# Patient Record
Sex: Female | Born: 1975 | Race: White | Hispanic: No | Marital: Single | Smoking: Current every day smoker
Health system: Southern US, Community
[De-identification: ages and names within clinical notes are randomized; demographics above are authoritative.]

## PROBLEM LIST (undated history)

## (undated) DIAGNOSIS — I10 Essential (primary) hypertension: Secondary | ICD-10-CM

## (undated) DIAGNOSIS — O24419 Gestational diabetes mellitus in pregnancy, unspecified control: Secondary | ICD-10-CM

## (undated) HISTORY — DX: Gestational diabetes mellitus in pregnancy, unspecified control: O24.419

## (undated) HISTORY — PX: WISDOM TOOTH EXTRACTION: SHX21

## (undated) HISTORY — DX: Essential (primary) hypertension: I10

---

## 2003-09-29 ENCOUNTER — Encounter: Payer: Self-pay | Admitting: Family Medicine

## 2003-09-29 LAB — CONVERTED CEMR LAB

## 2005-07-23 ENCOUNTER — Ambulatory Visit: Payer: Self-pay | Admitting: Family Medicine

## 2006-03-16 ENCOUNTER — Ambulatory Visit: Payer: Self-pay | Admitting: Family Medicine

## 2006-04-14 ENCOUNTER — Ambulatory Visit: Payer: Self-pay | Admitting: Family Medicine

## 2006-07-06 DIAGNOSIS — F17200 Nicotine dependence, unspecified, uncomplicated: Secondary | ICD-10-CM

## 2006-07-06 DIAGNOSIS — M545 Low back pain: Secondary | ICD-10-CM

## 2006-07-06 DIAGNOSIS — O99210 Obesity complicating pregnancy, unspecified trimester: Secondary | ICD-10-CM

## 2006-07-06 DIAGNOSIS — E781 Pure hyperglyceridemia: Secondary | ICD-10-CM | POA: Insufficient documentation

## 2006-07-06 DIAGNOSIS — I10 Essential (primary) hypertension: Secondary | ICD-10-CM | POA: Insufficient documentation

## 2007-08-01 ENCOUNTER — Ambulatory Visit: Payer: Self-pay | Admitting: Family Medicine

## 2007-08-01 ENCOUNTER — Encounter: Payer: Self-pay | Admitting: Family Medicine

## 2007-08-01 DIAGNOSIS — J069 Acute upper respiratory infection, unspecified: Secondary | ICD-10-CM | POA: Insufficient documentation

## 2016-12-21 ENCOUNTER — Other Ambulatory Visit (HOSPITAL_COMMUNITY)
Admission: RE | Admit: 2016-12-21 | Discharge: 2016-12-21 | Disposition: A | Discharge status: Home / Self Care | Payer: Medicaid Other | Source: Ambulatory Visit | Attending: Obstetrics & Gynecology | Admitting: Obstetrics & Gynecology

## 2016-12-21 ENCOUNTER — Encounter: Payer: Self-pay | Admitting: Obstetrics & Gynecology

## 2016-12-21 ENCOUNTER — Ambulatory Visit (INDEPENDENT_AMBULATORY_CARE_PROVIDER_SITE_OTHER): Payer: Medicaid Other | Admitting: Obstetrics & Gynecology

## 2016-12-21 VITALS — BP 110/70 | HR 88 | Wt 217.0 lb

## 2016-12-21 DIAGNOSIS — O99212 Obesity complicating pregnancy, second trimester: Secondary | ICD-10-CM

## 2016-12-21 DIAGNOSIS — O09892 Supervision of other high risk pregnancies, second trimester: Secondary | ICD-10-CM | POA: Insufficient documentation

## 2016-12-21 DIAGNOSIS — O23592 Infection of other part of genital tract in pregnancy, second trimester: Secondary | ICD-10-CM | POA: Diagnosis not present

## 2016-12-21 DIAGNOSIS — Z3689 Encounter for other specified antenatal screening: Secondary | ICD-10-CM | POA: Insufficient documentation

## 2016-12-21 DIAGNOSIS — O99332 Smoking (tobacco) complicating pregnancy, second trimester: Secondary | ICD-10-CM | POA: Insufficient documentation

## 2016-12-21 DIAGNOSIS — E669 Obesity, unspecified: Secondary | ICD-10-CM

## 2016-12-21 DIAGNOSIS — Z3A22 22 weeks gestation of pregnancy: Secondary | ICD-10-CM | POA: Insufficient documentation

## 2016-12-21 DIAGNOSIS — B9689 Other specified bacterial agents as the cause of diseases classified elsewhere: Secondary | ICD-10-CM | POA: Insufficient documentation

## 2016-12-21 DIAGNOSIS — O10912 Unspecified pre-existing hypertension complicating pregnancy, second trimester: Secondary | ICD-10-CM

## 2016-12-21 DIAGNOSIS — O99210 Obesity complicating pregnancy, unspecified trimester: Secondary | ICD-10-CM

## 2016-12-21 DIAGNOSIS — Z3480 Encounter for supervision of other normal pregnancy, unspecified trimester: Secondary | ICD-10-CM

## 2016-12-21 DIAGNOSIS — O09522 Supervision of elderly multigravida, second trimester: Secondary | ICD-10-CM

## 2016-12-21 DIAGNOSIS — O34219 Maternal care for unspecified type scar from previous cesarean delivery: Secondary | ICD-10-CM

## 2016-12-21 DIAGNOSIS — O09529 Supervision of elderly multigravida, unspecified trimester: Secondary | ICD-10-CM | POA: Insufficient documentation

## 2016-12-21 DIAGNOSIS — O10919 Unspecified pre-existing hypertension complicating pregnancy, unspecified trimester: Secondary | ICD-10-CM | POA: Insufficient documentation

## 2016-12-21 MED ORDER — METRONIDAZOLE 500 MG PO TABS: 500.0000 mg | ORAL_TABLET | Freq: Two times a day (BID) | ORAL | 0 refills | Status: DC

## 2016-12-21 MED ORDER — ASPIRIN 81 MG PO CHEW: 81.0000 mg | CHEWABLE_TABLET | Freq: Every day | ORAL | 3 refills | Status: AC

## 2016-12-21 NOTE — Progress Notes (Signed)
  Subjective:    Tracey FredricksonMisty D Strothers is a [id number] 6432w2d being seen today for her first obstetrical visit.  Her obstetrical history is significant for advanced maternal age, obesity, smoker and h/o c/s x 3, h/o GDM, CHTN. Patient does not intend to breast feed. Pregnancy history fully reviewed.  Patient reports no complaints.  Vitals:   12/21/16 1351  BP: 110/70  Pulse: 88  Weight: 217 lb (98.4 kg)    HISTORY: OB History  Gravida Para Term Preterm AB Living  3 2 2     2   SAB TAB Ectopic Multiple Live Births               # Outcome Date GA Lbr Len/2nd Weight Sex Delivery Anes PTL Lv  3 Current           2 Term     M CS-Unspec     1 Term     M CS-Unspec        Past Medical History:  Diagnosis Date  . Gestational diabetes   . Hypertension    Past Surgical History:  Procedure Laterality Date  . WISDOM TOOTH EXTRACTION     History reviewed. No pertinent family history.   Exam    Uterus:     Pelvic Exam:    Perineum: No Hemorrhoids   Vulva: normal   Vagina:  normal mucosa, discharge c/w BV       Cervix: anteverted   Adnexa: normal adnexa   Bony Pelvis: android  System: Breast:  normal appearance, no masses or tenderness   Skin: normal coloration and turgor, no rashes    Neurologic: oriented   Extremities: normal strength, tone, and muscle mass   HEENT PERRLA   Mouth/Teeth mucous membranes moist, pharynx normal without lesions   Neck supple   Cardiovascular: regular rate and rhythm   Respiratory:  appears well, vitals normal, no respiratory distress, acyanotic, normal RR, ear and throat exam is normal, neck free of mass or lymphadenopathy, chest clear, no wheezing, crepitations, rhonchi, normal symmetric air entry   Abdomen: soft, non-tender; bowel sounds normal; no masses,  no organomegaly   Urinary: urethral meatus normal      Assessment:    Pregnancy: [id number] Patient Active Problem List   Diagnosis Date Noted  . AMA (advanced maternal age) multigravida 35+  12/21/2016  . Supervision of other high risk pregnancies, second trimester 12/21/2016  . Previous cesarean delivery, delivered 12/21/2016  . Tobacco smoking complicating pregnancy in second trimester 12/21/2016  . Chronic hypertension in pregnancy 12/21/2016  . RESPIRATORY DISORDER, ACUTE 08/01/2007  . HYPERTRIGLYCERIDEMIA 07/06/2006  . Obesity in pregnancy 07/06/2006  . TOBACCO DEPENDENCE 07/06/2006  . HYPERTENSION, BENIGN SYSTEMIC 07/06/2006  . LOW BACK PAIN 07/06/2006        Plan:     Initial labs drawn. Prenatal vitamins. Problem list reviewed and updated. NIPS today  Ultrasound discussed; fetal survey: ordered.  Follow up in 4 weeks. Check hbA1C today and 2 hour GTT at next visit Rec stop smoking, no more weight gain Sign MCD forms for BTL today  Tracey BossierMyra C Militza Silva 12/21/2016

## 2016-12-22 LAB — PRENATAL PROFILE (SOLSTAS)
Antibody Screen: NEGATIVE
BASOS PCT: 0 %
Basophils Absolute: 0 cells/uL (ref 0–200)
Eosinophils Absolute: 222 cells/uL (ref 15–500)
Eosinophils Relative: 2 %
HEMATOCRIT: 32 % — AB (ref 35.0–45.0)
HIV: NONREACTIVE
Hemoglobin: 10.4 g/dL — ABNORMAL LOW (ref 11.7–15.5)
Hepatitis B Surface Ag: NEGATIVE
LYMPHS ABS: 1998 {cells}/uL (ref 850–3900)
LYMPHS PCT: 18 %
MCH: 28.1 pg (ref 27.0–33.0)
MCHC: 32.5 g/dL (ref 32.0–36.0)
MCV: 86.5 fL (ref 80.0–100.0)
MONO ABS: 999 {cells}/uL — AB (ref 200–950)
MPV: 9 fL (ref 7.5–12.5)
Monocytes Relative: 9 %
Neutro Abs: 7881 cells/uL — ABNORMAL HIGH (ref 1500–7800)
Neutrophils Relative %: 71 %
PLATELETS: 289 10*3/uL (ref 140–400)
RBC: 3.7 MIL/uL — AB (ref 3.80–5.10)
RDW: 15.7 % — AB (ref 11.0–15.0)
RH TYPE: POSITIVE
Rubella: >33 Index — ABNORMAL HIGH (ref <0.90)
WBC: 11.1 10*3/uL — AB (ref 3.8–10.8)

## 2016-12-22 LAB — HEMOGLOBIN A1C
Hgb A1c MFr Bld: 5.8 % — ABNORMAL HIGH (ref <5.7)
MEAN PLASMA GLUCOSE: 120 mg/dL

## 2016-12-23 LAB — URINE CULTURE

## 2016-12-24 LAB — PAIN MGMT, PROFILE 6 CONF W/O MM, U
6 Acetylmorphine: NEGATIVE ng/mL (ref <10)
ALCOHOL METABOLITES: NEGATIVE ng/mL (ref <500)
ALPHAHYDROXYALPRAZOLAM: NEGATIVE ng/mL (ref <25)
ALPHAHYDROXYMIDAZOLAM: NEGATIVE ng/mL (ref <50)
AMINOCLONAZEPAM: NEGATIVE ng/mL (ref <25)
Alphahydroxytriazolam: NEGATIVE ng/mL (ref <50)
Amphetamines: NEGATIVE ng/mL (ref <500)
BENZODIAZEPINES: NEGATIVE ng/mL (ref <100)
Barbiturates: NEGATIVE ng/mL (ref <300)
Benzoylecgonine: 25092 ng/mL — ABNORMAL HIGH (ref <100)
Cocaine Metabolite: POSITIVE ng/mL — AB (ref <150)
Creatinine: 130.3 mg/dL (ref >=20.0)
HYDROXYETHYLFLURAZEPAM: NEGATIVE ng/mL (ref <50)
LORAZEPAM: NEGATIVE ng/mL (ref <50)
METHADONE METABOLITE: NEGATIVE ng/mL (ref <100)
Marijuana Metabolite: 86 ng/mL — ABNORMAL HIGH (ref <5)
Marijuana Metabolite: POSITIVE ng/mL — AB (ref <20)
NORDIAZEPAM: NEGATIVE ng/mL (ref <50)
OPIATES: NEGATIVE ng/mL (ref <100)
OXAZEPAM: NEGATIVE ng/mL (ref <50)
Oxidant: NEGATIVE ug/mL (ref <200)
Oxycodone: NEGATIVE ng/mL (ref <100)
PHENCYCLIDINE: NEGATIVE ng/mL (ref <25)
Please note:: 0
Temazepam: NEGATIVE ng/mL (ref <50)
pH: 7.45 (ref 4.5–9.0)

## 2016-12-24 LAB — CYTOLOGY - PAP
Bacterial vaginitis: POSITIVE — AB
CANDIDA VAGINITIS: NEGATIVE
Chlamydia: NEGATIVE
Diagnosis: NEGATIVE
HPV: NOT DETECTED
Neisseria Gonorrhea: NEGATIVE
TRICH (WINDOWPATH): NEGATIVE

## 2016-12-24 LAB — GC/CHLAMYDIA PROBE AMP (~~LOC~~) NOT AT ARMC
Chlamydia: NEGATIVE
Neisseria Gonorrhea: NEGATIVE

## 2016-12-30 ENCOUNTER — Encounter: Payer: Self-pay | Admitting: Obstetrics & Gynecology

## 2016-12-30 DIAGNOSIS — F1490 Cocaine use, unspecified, uncomplicated: Secondary | ICD-10-CM

## 2016-12-30 DIAGNOSIS — F1290 Cannabis use, unspecified, uncomplicated: Secondary | ICD-10-CM | POA: Insufficient documentation

## 2016-12-30 DIAGNOSIS — O99320 Drug use complicating pregnancy, unspecified trimester: Secondary | ICD-10-CM | POA: Insufficient documentation

## 2016-12-31 ENCOUNTER — Other Ambulatory Visit: Payer: Self-pay | Admitting: *Deleted

## 2017-01-07 ENCOUNTER — Ambulatory Visit (HOSPITAL_COMMUNITY)
Admission: RE | Admit: 2017-01-07 | Discharge: 2017-01-07 | Disposition: A | Discharge status: Home / Self Care | Payer: Medicaid Other | Source: Ambulatory Visit | Attending: Obstetrics & Gynecology | Admitting: Obstetrics & Gynecology

## 2017-01-07 ENCOUNTER — Other Ambulatory Visit: Payer: Self-pay | Admitting: Obstetrics & Gynecology

## 2017-01-07 DIAGNOSIS — Z3A24 24 weeks gestation of pregnancy: Secondary | ICD-10-CM

## 2017-01-07 DIAGNOSIS — O34219 Maternal care for unspecified type scar from previous cesarean delivery: Secondary | ICD-10-CM | POA: Insufficient documentation

## 2017-01-07 DIAGNOSIS — O09522 Supervision of elderly multigravida, second trimester: Secondary | ICD-10-CM

## 2017-01-07 DIAGNOSIS — Z369 Encounter for antenatal screening, unspecified: Secondary | ICD-10-CM | POA: Diagnosis present

## 2017-01-07 DIAGNOSIS — Z3A22 22 weeks gestation of pregnancy: Secondary | ICD-10-CM | POA: Diagnosis present

## 2017-01-07 DIAGNOSIS — O99212 Obesity complicating pregnancy, second trimester: Secondary | ICD-10-CM | POA: Diagnosis not present

## 2017-01-07 DIAGNOSIS — E669 Obesity, unspecified: Secondary | ICD-10-CM

## 2017-01-14 ENCOUNTER — Telehealth: Payer: Self-pay | Admitting: *Deleted

## 2017-01-14 NOTE — Telephone Encounter (Signed)
Pt called to say that she was moving to [place] today and would call the office when she gets settled to request her PN records.  She stated that this was a last minute decision and she has no other choice.

## 2017-01-18 ENCOUNTER — Encounter: Payer: Medicaid Other | Admitting: Advanced Practice Midwife

## 2017-03-04 ENCOUNTER — Ambulatory Visit (INDEPENDENT_AMBULATORY_CARE_PROVIDER_SITE_OTHER): Payer: Medicaid Other | Admitting: Obstetrics & Gynecology

## 2017-03-04 DIAGNOSIS — O09892 Supervision of other high risk pregnancies, second trimester: Secondary | ICD-10-CM | POA: Diagnosis not present

## 2017-03-04 NOTE — Progress Notes (Signed)
   PRENATAL VISIT NOTE  Subjective:  Tracey Silva is a 41 y.o. G3P2002 at 4551w5d being seen today for ongoing prenatal care.  She is currently monitored for the following issues for this high-risk pregnancy and has HYPERTRIGLYCERIDEMIA; Obesity in pregnancy; TOBACCO DEPENDENCE; HYPERTENSION, BENIGN SYSTEMIC; RESPIRATORY DISORDER, ACUTE; LOW BACK PAIN; AMA (advanced maternal age) multigravida 35+; Supervision of other high risk pregnancies, second trimester; Previous cesarean delivery, delivered; Tobacco smoking complicating pregnancy in second trimester; Chronic hypertension in pregnancy; Marijuana use; and Cocaine use complicating pregnancy on her problem list.  Patient reports no complaints.  Contractions: Irritability. Vag. Bleeding: None.  Movement: Present. Denies leaking of fluid.  She reports that she has been in [place] and just recently returned. Says that she had bleeding and contractions there and was seen and discharged from the hospital (although she says next that they wanted her to stay). She denies any more VB or CTXs.  The following portions of the patient's history were reviewed and updated as appropriate: allergies, current medications, past family history, past medical history, past social history, past surgical history and problem list. Problem list updated.  Objective:   Vitals:   03/04/17 1313  BP: 127/76  Pulse: (!) 113  Weight: 224 lb (101.6 kg)    Fetal Status: Fetal Heart Rate (bpm): 140   Movement: Present     General:  Alert, oriented and cooperative. Patient is in no acute distress.  Skin: Skin is warm and dry. No rash noted.   Cardiovascular: Normal heart rate noted  Respiratory: Normal respiratory effort, no problems with respiration noted  Abdomen: Soft, gravid, appropriate for gestational age. Pain/Pressure: Absent     Pelvic:  Cervical exam deferred        Extremities: Normal range of motion.  Edema: Trace  Mental Status: Normal mood and affect. Normal  behavior. Normal judgment and thought content.   Assessment and Plan:  Pregnancy: G3P2002 at 4651w5d  1. Supervision of other high risk pregnancies, second trimester - 2 hour GTT today, schedule MFM u/s asap  Preterm labor symptoms and general obstetric precautions including but not limited to vaginal bleeding, contractions, leaking of fluid and fetal movement were reviewed in detail with the patient. Please refer to After Visit Summary for other counseling recommendations.  No Follow-up on file.   Allie BossierMyra C Jahleah Mariscal, MD

## 2017-03-04 NOTE — Progress Notes (Signed)
Was in [place] for 6 weeks and has not had 28 week lab

## 2017-03-04 NOTE — Addendum Note (Signed)
Addended by: Mariel AloeLARK, Bastion Bolger on: 03/04/2017 01:48 PM   Modules accepted: Orders

## 2017-03-15 ENCOUNTER — Encounter: Payer: Medicaid Other | Admitting: Obstetrics & Gynecology

## 2017-03-15 ENCOUNTER — Encounter (HOSPITAL_COMMUNITY): Payer: Self-pay

## 2017-03-15 ENCOUNTER — Ambulatory Visit (HOSPITAL_COMMUNITY): Payer: Medicaid Other | Attending: Obstetrics & Gynecology

## 2017-03-30 ENCOUNTER — Telehealth: Payer: Self-pay

## 2017-03-30 NOTE — Telephone Encounter (Signed)
Patient no showed for appt on 6-18. Called pt to reschedule appt. Patient family member (mother) stated she think [facility] and will have someone or the patient call me back to confirm.

## 2017-08-02 ENCOUNTER — Encounter (HOSPITAL_COMMUNITY): Payer: Self-pay

## 2017-09-24 IMAGING — US US OB DETAIL+14 WK
1 series · 13 of 28 positions shown · non-contrast
Comparison: none

[Series 1: us ob detail+14 wk · 72 acquisitions, 13 frames shown]
[im 3/72]
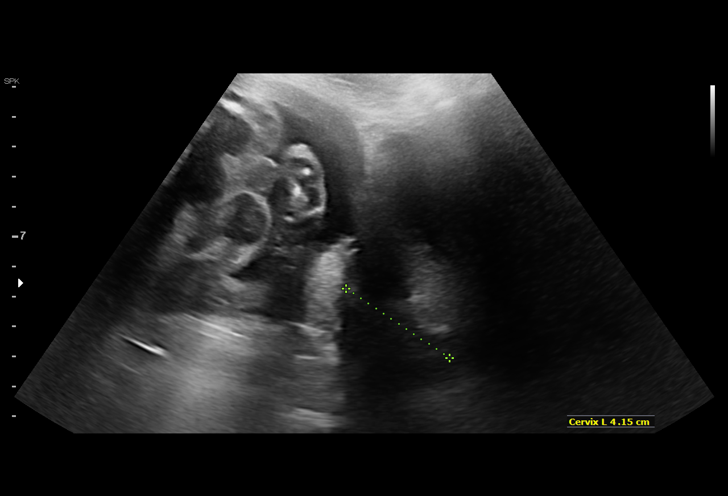
[im 8/72]
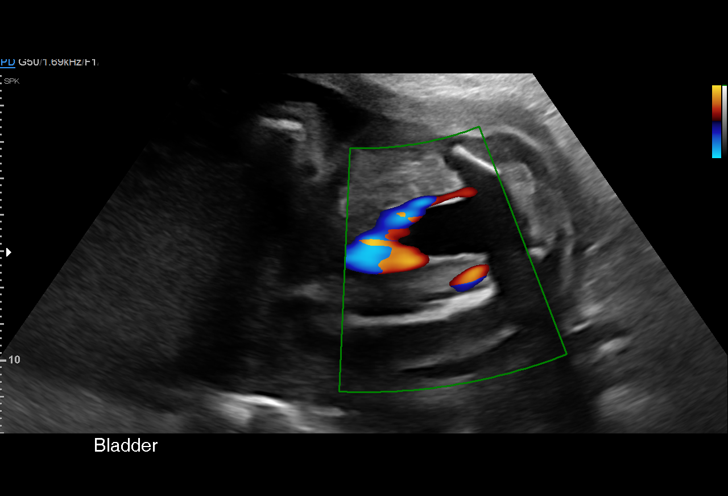
[im 14/72]
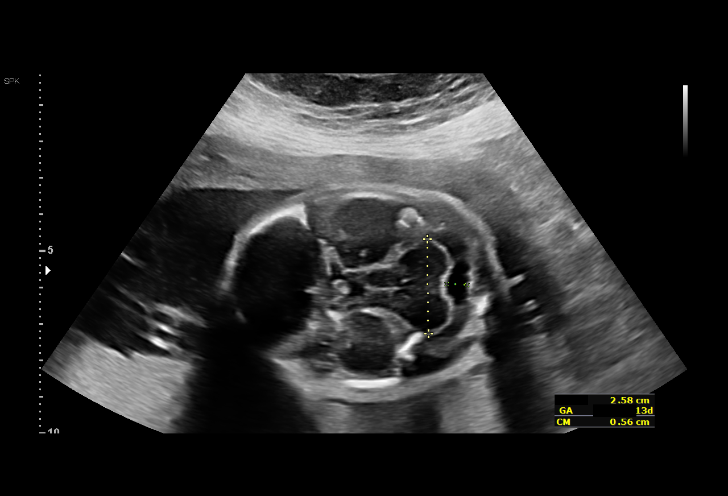
[im 19/72]
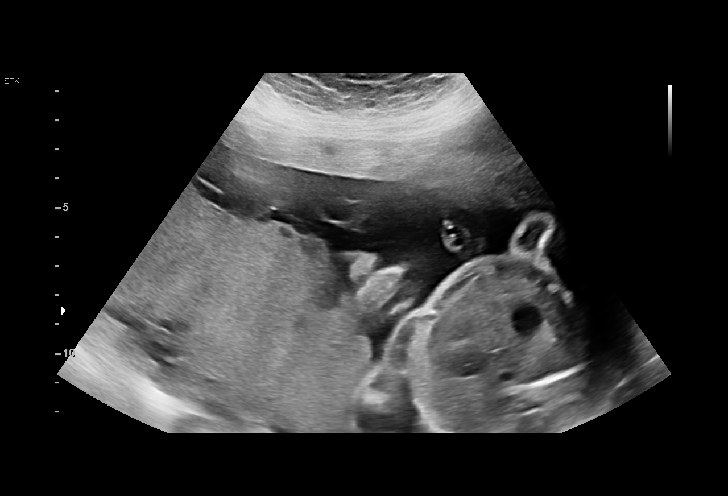
[im 24/72]
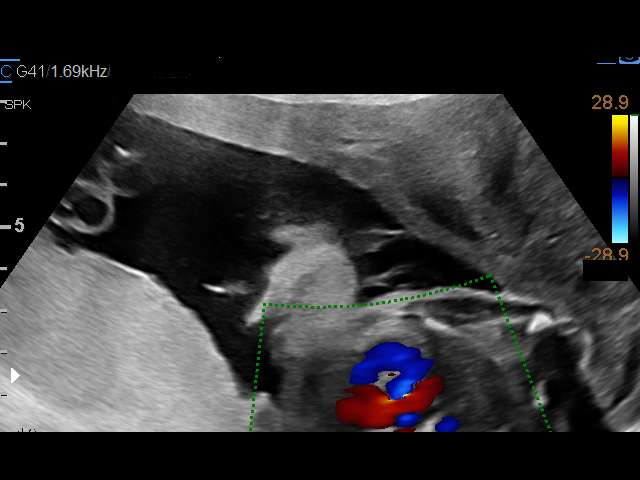
[im 29/72]
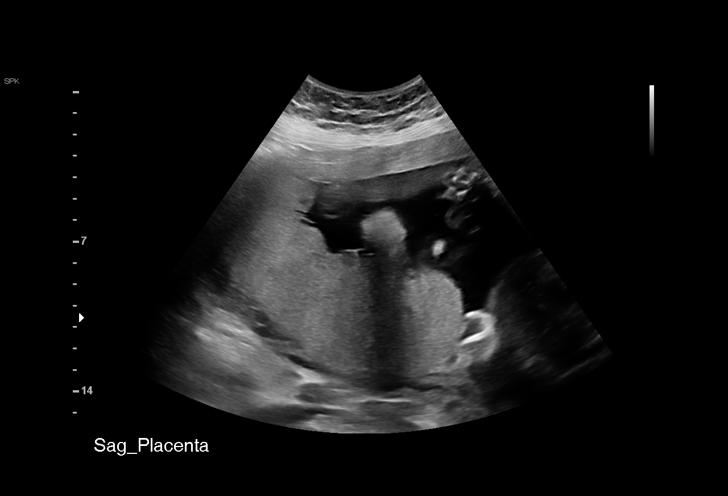
[im 37/72]
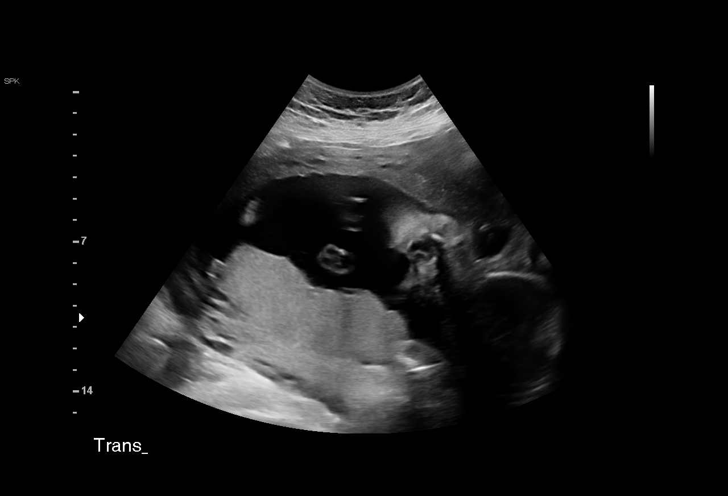
[im 43/72]
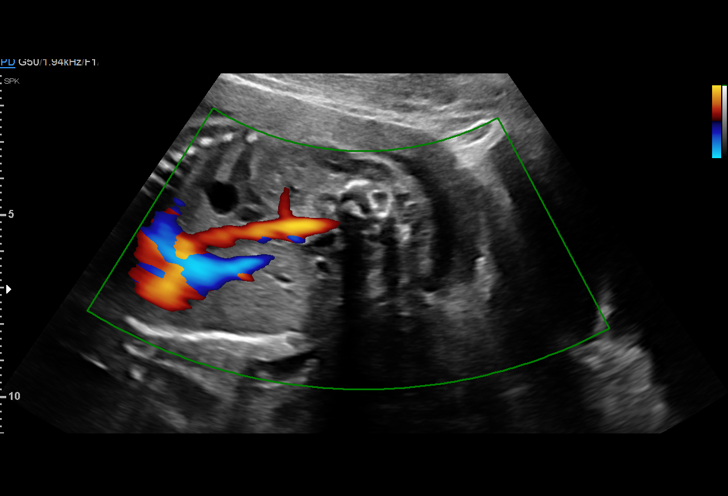
[im 48/72]
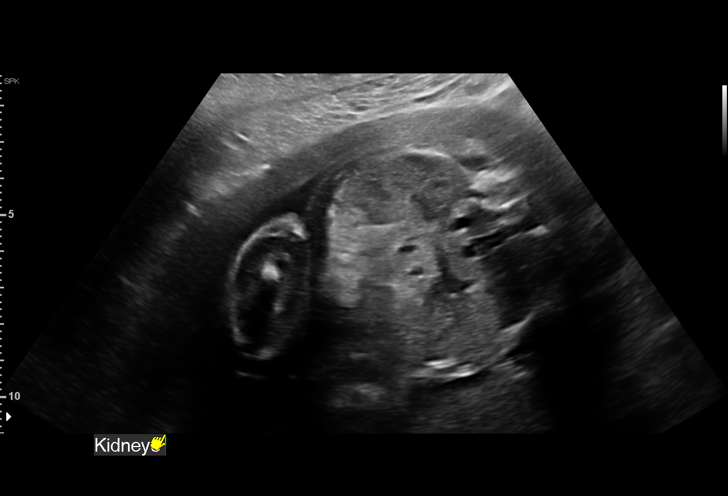
[im 53/72]
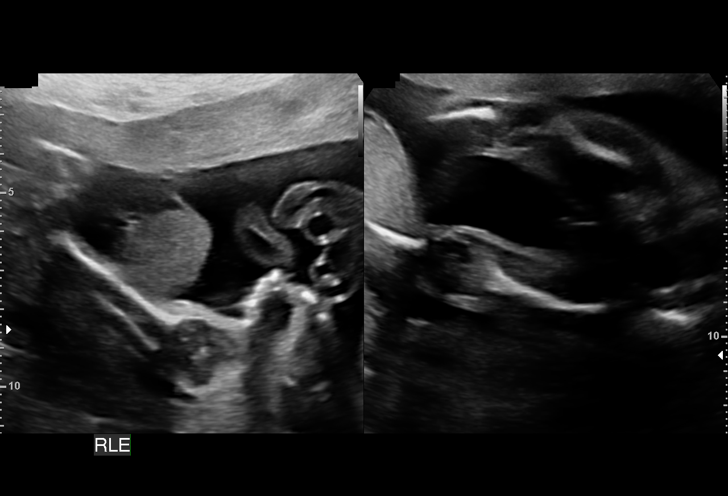
[im 58/72]
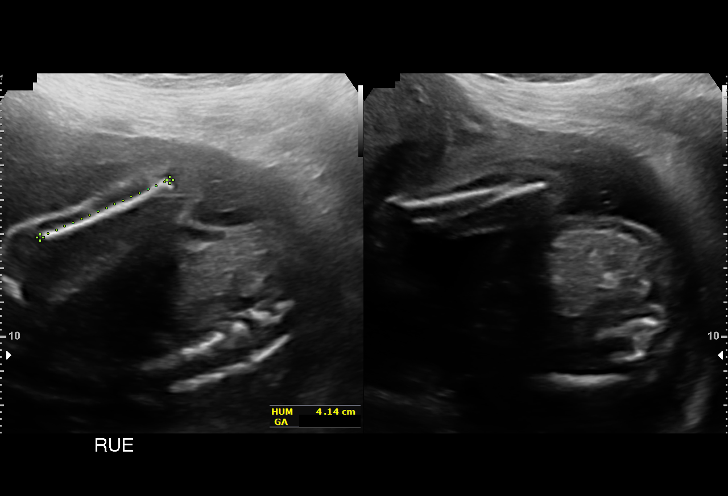
[im 64/72]
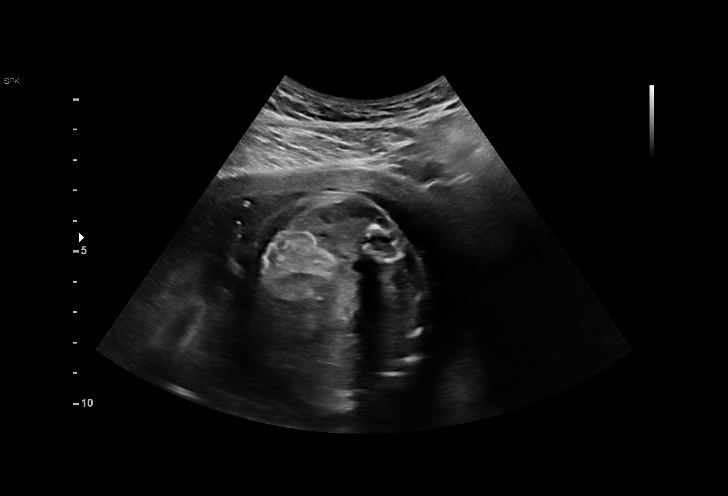
[im 69/72]
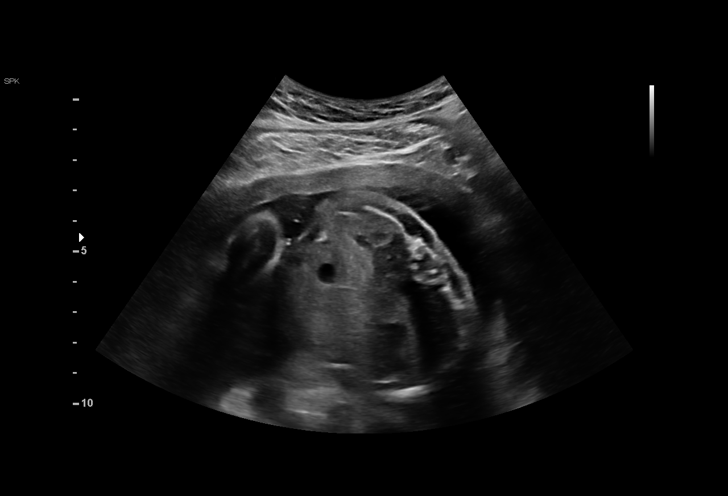

[13 of 28 positions shown; findings below may reference images not displayed]

1  US OB DETAIL + 14 WK                        76811.0

1  VLADIMIR MICHELSON                [id number]      [id number]     [id number]
Indications

24 weeks gestation of pregnancy
Advanced maternal age multigravida 35+,
second trimester
Obesity complicating pregnancy, second
trimester previou
Previous cesarean delivery, antepartum
OB History

Blood Type:            Height:  5'5"   Weight (lb):  217      BMI:
Gravidity:    3         Term:   2
Living:       2
Fetal Evaluation

Num Of Fetuses:     1
Fetal Heart         134
Rate(bpm):
Cardiac Activity:   Observed
Presentation:       Breech
Placenta:           Posterior, above cervical os
P. Cord Insertion:  Visualized, central

Amniotic Fluid
AFI FV:      Subjectively within normal limits
Biometry

BPD:      55.4  mm     G. Age:  22w 6d          2  %    CI:        67.89   %   70 - 86
FL/HC:      19.8   %   18.7 -
HC:      215.1  mm     G. Age:  23w 4d          5  %    HC/AC:      0.96       1.04 -
AC:      223.9  mm     G. Age:  26w 6d         92  %    FL/BPD:     76.9   %   71 - 87
FL:       42.6  mm     G. Age:  24w 0d         16  %    FL/AC:      19.0   %   20 - 24
HUM:      42.5  mm     G. Age:  25w 4d         62  %
CER:      25.8  mm     G. Age:  23w 6d         23  %
CM:        5.6  mm

Est. FW:     786  gm    1 lb 12 oz      63  %
Gestational Age

U/S Today:     24w 2d                                        EDD:   04/27/17
Best:          24w 5d    Det. By:   Previous Ultrasound      EDD:   04/24/17
(10/28/16)
Anatomy

Cranium:               Appears normal         Aortic Arch:            Not well visualized
Cavum:                 Appears normal         Ductal Arch:            Not well visualized
Ventricles:            Appears normal         Diaphragm:              Appears normal
Choroid Plexus:        Appears normal         Stomach:                Appears normal, left
sided
Cerebellum:            Appears normal         Abdomen:                Appears normal
Posterior Fossa:       Appears normal         Abdominal Wall:         Appears nml (cord
insert, abd wall)
Nuchal Fold:           Not applicable (>20    Cord Vessels:           Appears normal (3
wks GA)                                        vessel cord)
Face:                  Appears normal         Kidneys:                Appear normal
(orbits and profile)
Lips:                  Appears normal         Bladder:                Appears normal
Thoracic:              Appears normal         Spine:                  Limited views
appear normal
Heart:                 Appears normal         Upper Extremities:      Visualized
(4CH, axis, and situs
RVOT:                  Appears normal         Lower Extremities:      Visualized
LVOT:                  Appears normal

Other:  Fetus appears to male Both heels are visualized  neither digits seen
Technically difficult due to maternal habitus and fetal position.
Cervix Uterus Adnexa

Cervix
Length:            3.8  cm.
Normal appearance by transabdominal scan.

Uterus
Single fibroid noted, see table below.

Left Ovary
Not visualized.

Right Ovary
Within normal limits.

Adnexa:       No abnormality visualized.
Myomas

Site                     L(cm)      W(cm)      D(cm)      Location
Anterior
Blood Flow                 RI        PI       Comments
Impression

Single IUP at 24w 5d
Advanced gestational age, Obesity
Limited views of the fetal spine and heart (aortic / ductal
arches) were obtained
The remainder of the fetal anatomy appears normal
The estimated fetal weight is at the 63rd %tile
An anterior uterine myoma is noted as described above
Posterior placenta without previa
Normal amniotic fluid volume
Recommendations

Recommend follow-up ultrasound examination in 4 weeks for
interval growth
